# Patient Record
Sex: Female | Born: 1965 | Race: Black or African American | Hispanic: No | Marital: Single | Smoking: Never smoker
Health system: Southern US, Community
[De-identification: ages and names within clinical notes are randomized; demographics above are authoritative.]

## PROBLEM LIST (undated history)

## (undated) DIAGNOSIS — I10 Essential (primary) hypertension: Secondary | ICD-10-CM

## (undated) HISTORY — PX: NO PAST SURGERIES: SHX2092

---

## 1998-09-18 ENCOUNTER — Emergency Department (HOSPITAL_COMMUNITY): Admission: EM | Admit: 1998-09-18 | Discharge: 1998-09-18 | Payer: Self-pay | Admitting: Emergency Medicine

## 1998-12-31 ENCOUNTER — Emergency Department (HOSPITAL_COMMUNITY): Admission: EM | Admit: 1998-12-31 | Discharge: 1998-12-31 | Payer: Self-pay | Admitting: Emergency Medicine

## 2003-09-03 ENCOUNTER — Emergency Department (HOSPITAL_COMMUNITY): Admission: AD | Admit: 2003-09-03 | Discharge: 2003-09-03 | Payer: Self-pay | Admitting: Family Medicine

## 2007-05-06 ENCOUNTER — Emergency Department (HOSPITAL_COMMUNITY): Admission: EM | Admit: 2007-05-06 | Discharge: 2007-05-06 | Payer: Self-pay | Admitting: Emergency Medicine

## 2008-11-18 ENCOUNTER — Encounter: Admission: RE | Admit: 2008-11-18 | Discharge: 2008-11-18 | Payer: Self-pay | Admitting: Family Medicine

## 2009-06-04 ENCOUNTER — Emergency Department (HOSPITAL_COMMUNITY): Admission: EM | Admit: 2009-06-04 | Discharge: 2009-06-04 | Payer: Self-pay | Admitting: Family Medicine

## 2011-05-21 ENCOUNTER — Inpatient Hospital Stay (HOSPITAL_COMMUNITY)
Admission: AD | Admit: 2011-05-21 | Discharge: 2011-05-21 | Disposition: A | Discharge status: Home / Self Care | Payer: Self-pay | Source: Ambulatory Visit | Attending: Obstetrics & Gynecology | Admitting: Obstetrics & Gynecology

## 2011-05-21 ENCOUNTER — Encounter (HOSPITAL_COMMUNITY): Payer: Self-pay | Admitting: *Deleted

## 2011-05-21 DIAGNOSIS — L02419 Cutaneous abscess of limb, unspecified: Secondary | ICD-10-CM

## 2011-05-21 DIAGNOSIS — L03119 Cellulitis of unspecified part of limb: Secondary | ICD-10-CM

## 2011-05-21 DIAGNOSIS — L02423 Furuncle of right upper limb: Secondary | ICD-10-CM

## 2011-05-21 DIAGNOSIS — L02429 Furuncle of limb, unspecified: Secondary | ICD-10-CM

## 2011-05-21 DIAGNOSIS — L02439 Carbuncle of limb, unspecified: Secondary | ICD-10-CM

## 2011-05-21 HISTORY — DX: Essential (primary) hypertension: I10

## 2011-05-21 MED ORDER — CEPHALEXIN 500 MG PO CAPS: 500.0000 mg | ORAL_CAPSULE | Freq: Four times a day (QID) | ORAL | Status: AC

## 2011-05-21 NOTE — ED Notes (Signed)
2150 Kerri Daniels CNM in to see pt

## 2011-05-21 NOTE — ED Notes (Signed)
Are R posterior thigh has reddened area about 10cm size around "boil" area

## 2011-05-21 NOTE — Progress Notes (Signed)
Bump started on R posterior thigh and was itching on Tues. Grew larger by Weds am and a lot of pain. Then another area started On R buttocks and had similar symptoms. Noticed area on L lower back today that was itching somewhat like the other bumps started. Also has bump on L forehead. Has bandaid on area on thigh and buttocks and drainage noted on bandaid.

## 2011-05-21 NOTE — ED Provider Notes (Signed)
History     Chief Complaint  Patient presents with  . Sore   HPI Presents with c/o "boils" on right leg and buttocks. States started 5 days ago as "mosquito bite" and got bigger. No fever or other symptoms. No prior treatment. Does not have a primary care doctor.    Past Medical History  Diagnosis Date  . Diabetes mellitus   . Hypertension     Past Surgical History  Procedure Date  . No past surgeries     No family history on file.  History  Substance Use Topics  . Smoking status: Never Smoker   . Smokeless tobacco: Not on file  . Alcohol Use: No    Allergies: Allergies no known allergies  Prescriptions prior to admission  Medication Sig Dispense Refill  . Amlodipine Besy-Benazepril HCl (LOTREL PO) Take 1 tablet by mouth every morning.        . AZO-CRANBERRY PO Take 2 tablets by mouth daily.        . Benzocaine-Ichthammol-Sulfur (BOIL-EASE) 5-1.86-0.44 % OINT Apply 1 application topically daily as needed. For boils        . Biotin 5000 MCG TABS Take 1 tablet by mouth daily.        . Black Cohosh 540 MG CAPS Take 1 capsule by mouth daily.        . Ferrous Sulfate 143 (45 FE) MG TBCR Take 1 tablet by mouth daily.        . metFORMIN (GLUCOPHAGE-XR) 500 MG 24 hr tablet Take 2,000 mg by mouth daily with supper.        . Multiple Vitamin (MULTI-VITAMIN DAILY PO) Take 1 tablet by mouth daily.        . naproxen sodium (ANAPROX) 220 MG tablet Take 220 mg by mouth daily.        Marland Kitchen OVER THE COUNTER MEDICATION Take 1 capsule by mouth daily. Energ-V herbal supplement       . phentermine (ADIPEX-P) 37.5 MG tablet Take 37.5 mg by mouth every morning.          Review of Systems  Constitutional: Negative for fever and chills.  Skin: Negative for rash.    Physical Exam   Blood pressure 151/97, pulse 97, temperature 98.2 F (36.8 C), temperature source Oral, resp. rate 20, height 5\' 9"  (1.753 m), weight 204 lb 6.4 oz (92.715 kg), last menstrual period 05/05/2011.  Physical Exam   Constitutional: She is oriented to person, place, and time. She appears well-developed and well-nourished.  Respiratory: Effort normal.  Musculoskeletal: Normal range of motion.  Neurological: She is alert and oriented to person, place, and time.  Skin: Skin is warm and dry. Lesion noted.          Pustular lesion right inner thigh about 1cm with surrounding area of cellulitis about 10 cm.  Second lesion on right hip/buttocks, 1cm with no cellulitis. Tiny pustule Left lower back  Psychiatric: She has a normal mood and affect.    MAU Course  Procedures   Assessment and Plan  A:  Skin lesions with cellulitis P:  Consulted Dr Gaynell Face  Culture sent Will Rx Keflex and have her follow up with Dr Tamela Oddi Monday.    Wynelle Bourgeois 05/21/2011, 10:12 PM

## 2011-05-21 NOTE — ED Notes (Signed)
Wynelle Bourgeois CNM took culture of area on R thigh and sent to lab.

## 2011-05-21 NOTE — Progress Notes (Signed)
Written and verbal d/c instructions given and understanding voiced. 

## 2011-05-24 ENCOUNTER — Telehealth (HOSPITAL_COMMUNITY): Payer: Self-pay | Admitting: *Deleted

## 2011-05-24 LAB — CULTURE, ROUTINE-ABSCESS

## 2013-08-31 ENCOUNTER — Encounter (HOSPITAL_COMMUNITY): Payer: Self-pay | Admitting: Emergency Medicine

## 2013-08-31 ENCOUNTER — Emergency Department (HOSPITAL_COMMUNITY)
Admission: EM | Admit: 2013-08-31 | Discharge: 2013-09-01 | Disposition: A | Discharge status: Home / Self Care | Payer: Self-pay | Attending: Emergency Medicine | Admitting: Emergency Medicine

## 2013-08-31 DIAGNOSIS — Z792 Long term (current) use of antibiotics: Secondary | ICD-10-CM | POA: Insufficient documentation

## 2013-08-31 DIAGNOSIS — L02419 Cutaneous abscess of limb, unspecified: Secondary | ICD-10-CM | POA: Insufficient documentation

## 2013-08-31 DIAGNOSIS — L03119 Cellulitis of unspecified part of limb: Principal | ICD-10-CM

## 2013-08-31 DIAGNOSIS — Z79899 Other long term (current) drug therapy: Secondary | ICD-10-CM | POA: Insufficient documentation

## 2013-08-31 DIAGNOSIS — L02416 Cutaneous abscess of left lower limb: Secondary | ICD-10-CM

## 2013-08-31 DIAGNOSIS — E119 Type 2 diabetes mellitus without complications: Secondary | ICD-10-CM | POA: Insufficient documentation

## 2013-08-31 DIAGNOSIS — I10 Essential (primary) hypertension: Secondary | ICD-10-CM | POA: Insufficient documentation

## 2013-08-31 MED ORDER — CEPHALEXIN 500 MG PO CAPS: 500.0000 mg | ORAL_CAPSULE | Freq: Four times a day (QID) | ORAL | Status: DC

## 2013-08-31 MED ORDER — OXYCODONE-ACETAMINOPHEN 5-325 MG PO TABS: 1.0000 | ORAL_TABLET | Freq: Four times a day (QID) | ORAL | Status: DC | PRN

## 2013-08-31 MED ORDER — SULFAMETHOXAZOLE-TRIMETHOPRIM 800-160 MG PO TABS: 1.0000 | ORAL_TABLET | Freq: Two times a day (BID) | ORAL | Status: AC

## 2013-08-31 MED ORDER — IBUPROFEN 800 MG PO TABS
800.0000 mg | ORAL_TABLET | Freq: Once | ORAL | Status: AC
  Administered 2013-08-31: 800 mg via ORAL
  Filled 2013-08-31: qty 1

## 2013-08-31 NOTE — ED Notes (Signed)
Pt arrived to the ED with a complaint of leg sore.  Pt had a pimple on her leg which she popped on Sunday.  Pt is diabetic.  Pt take metformin.  Pt wound is now larger and white.  Wound appears closed . Wound is on left leg

## 2013-08-31 NOTE — ED Notes (Signed)
Gauze applied to wound and wrapped with kerlix. Pt instructed on further wound care.

## 2013-08-31 NOTE — Discharge Instructions (Signed)
Take the antibiotics as prescribed. Do not stop antibiotics early. Apply warm moist compresses or use warm soaks to the area at least 4 times a day to promote drainage. Change your dressing at least twice per day to keep the area clean and dry. Your first dressing change should be tomorrow morning. Followup in 48 hours with your primary care provider or in the emergency department for a recheck of your abscess. Return to the emergency department if symptoms worsen.  Abscess Care After An abscess (also called a boil or furuncle) is an infected area that contains a collection of pus. Signs and symptoms of an abscess include pain, tenderness, redness, or hardness, or you may feel a moveable soft area under your skin. An abscess can occur anywhere in the body. The infection may spread to surrounding tissues causing cellulitis. A cut (incision) by the surgeon was made over your abscess and the pus was drained out. Gauze may have been packed into the space to provide a drain that will allow the cavity to heal from the inside outwards. The boil may be painful for 5 to 7 days. Most people with a boil do not have high fevers. Your abscess, if seen early, may not have localized, and may not have been lanced. If not, another appointment may be required for this if it does not get better on its own or with medications. HOME CARE INSTRUCTIONS   Only take over-the-counter or prescription medicines for pain, discomfort, or fever as directed by your caregiver.  When you bathe, soak and then remove gauze or iodoform packs at least daily or as directed by your caregiver. You may then wash the wound gently with mild soapy water. Repack with gauze or do as your caregiver directs. SEEK IMMEDIATE MEDICAL CARE IF:   You develop increased pain, swelling, redness, drainage, or bleeding in the wound site.  You develop signs of generalized infection including muscle aches, chills, fever, or a general ill feeling.  An oral  temperature above 102 F (38.9 C) develops, not controlled by medication. See your caregiver for a recheck if you develop any of the symptoms described above. If medications (antibiotics) were prescribed, take them as directed. Document Released: 02/04/2005 Document Revised: 10/11/2011 Document Reviewed: 10/02/2007 [facility] Patient Information 2014 [employer], [place].

## 2013-08-31 NOTE — ED Provider Notes (Signed)
CSN: [id number]     Arrival date & time 08/31/13  2130 History   First MD Initiated Contact with Patient 08/31/13 2250     This chart was scribed for Kerri Daniels, by Ladona Ridgelaylor Day, ED scribe. This patient was seen in room WTR6/WTR6 and the patient's care was started at 2250.  Chief Complaint  Patient presents with  . Sore   Patient is a 48 y.o. female presenting with abscess. The history is provided by the patient. No language interpreter was used.  Abscess Location:  Leg Leg abscess location:  L upper leg Abscess quality: draining, painful, redness and warmth   Red streaking: no   Duration:  5 days Progression:  Worsening Pain details:    Severity:  Moderate   Duration:  5 days   Timing:  Constant Chronicity:  New Context: diabetes   Relieved by:  Nothing Ineffective treatments:  Draining/squeezing Associated symptoms: no fever    HPI Comments: Kerri Daniels is a 48 y.o. female who presents to the Emergency Department w/hx of DM complaining of painful/swollen constant, gradually worsened abscess to her left inner thigh, onset 5 days ago. She reports it began as a small pimple over her thigh which she popped 5 days ago and reports since this time it has worsened in pain/swelling. She reports associated warmth/tenderness of this area, no fever/chills. She has been using alcohol wipes, neosporin and betadine topically w/no improvement. She denies any red streaking, numbness/weakness of her legs. She reports clear drainage and no purulent drainage.   No allergies to medicines.   Past Medical History  Diagnosis Date  . Diabetes mellitus   . Hypertension    Past Surgical History  Procedure Laterality Date  . No past surgeries     History reviewed. No pertinent family history. History  Substance Use Topics  . Smoking status: Never Smoker   . Smokeless tobacco: Not on file  . Alcohol Use: Yes   OB History   Grav Para Term Preterm Abortions TAB SAB Ect Mult Living   7 7 7  0 0 0 0  0 0 6     Review of Systems  Constitutional: Negative for fever and chills.  Skin: Positive for wound (abscess left inenr thigh).  All other systems reviewed and are negative.   A complete 10 system review of systems was obtained and all systems are negative except as noted in the HPI and PMH.   Allergies  Review of patient's allergies indicates no known allergies.  Home Medications   Current Outpatient Rx  Name  Route  Sig  Dispense  Refill  . amLODipine-benazepril (LOTREL) 5-10 MG per capsule   Oral   Take 1 capsule by mouth every morning.         . Cholecalciferol (VITAMIN D-3 PO)   Oral   Take 1 tablet by mouth daily.         Marland Kitchen. ibuprofen (ADVIL,MOTRIN) 200 MG tablet   Oral   Take 200 mg by mouth every 6 (six) hours as needed for moderate pain.         . metFORMIN (GLUCOPHAGE-XR) 500 MG 24 hr tablet   Oral   Take 2,000 mg by mouth daily with supper.           . Multiple Vitamin (MULTI-VITAMIN DAILY PO)   Oral   Take 1 tablet by mouth daily.           . naproxen (NAPROSYN) 500 MG tablet   Oral  Take 500 mg by mouth 2 (two) times daily as needed for mild pain.         . vitamin B-12 (CYANOCOBALAMIN) 1000 MCG tablet   Oral   Take 1,000 mcg by mouth daily.         . cephALEXin (KEFLEX) 500 MG capsule   Oral   Take 1 capsule (500 mg total) by mouth 4 (four) times daily.   28 capsule   0   . oxyCODONE-acetaminophen (PERCOCET/ROXICET) 5-325 MG per tablet   Oral   Take 1 tablet by mouth every 6 (six) hours as needed for severe pain.   5 tablet   0   . sulfamethoxazole-trimethoprim (BACTRIM DS,SEPTRA DS) 800-160 MG per tablet   Oral   Take 1 tablet by mouth 2 (two) times daily.   14 tablet   0    Triage Vitals: BP 169/97  Pulse 105  Temp(Src) 98.2 F (36.8 C) (Oral)  Resp 18  SpO2 100%  LMP 08/16/2013  Physical Exam  Nursing note and vitals reviewed. Constitutional: She is oriented to person, place, and time. She appears  well-developed and well-nourished. No distress.  HENT:  Head: Normocephalic and atraumatic.  Eyes: Conjunctivae and EOM are normal. No scleral icterus.  Neck: Normal range of motion.  Cardiovascular: Normal rate, regular rhythm and intact distal pulses.   Dorsalis pedis and posterior tibial pulses 2+ in left lower extremity  Pulmonary/Chest: Effort normal. No respiratory distress.  Musculoskeletal: Normal range of motion.  Neurological: She is alert and oriented to person, place, and time. She has normal reflexes.  No gross deficits appreciated. DTRs normal and symmetric. Patient ambulatory with normal gait.  Skin: Skin is warm and dry. No rash noted. She is not diaphoretic. No pallor.  Patient with abscess to anterior mid left upper thigh. There is a central area of fluctuance approximately 1 cm in diameter with surrounding induration extending approximately 5 cm from center. Induration erythematous with mild heat to touch. No red linear streaking appreciated. Wound draining scant amount of purulent bloody drainage. Tender to palpation.  Psychiatric: She has a normal mood and affect. Her behavior is normal.    ED Course  Procedures (including critical care time) DIAGNOSTIC STUDIES: Oxygen Saturation is 100% on room air, normal by my interpretation.    Labs Review Labs Reviewed - No data to display Imaging Review No results found.  EKG Interpretation   None      INCISION AND DRAINAGE Performed by: Kerri Madura Consent: Verbal consent obtained. Risks and benefits: risks, benefits and alternatives were discussed Type: abscess  Body area: L thigh  Anesthesia: local infiltration  Incision was made with a scalpel.  Local anesthetic: lidocaine 2% with epinephrine  Anesthetic total: 7 ml  Complexity: complex Blunt dissection to break up loculations  Drainage: purulent and bloody  Drainage amount: moderate  Packing material: none  Patient tolerance: Patient tolerated  the procedure well with no immediate complications.   MDM   1. Abscess of left thigh    Uncomplicated abscess of left thigh. Patient without history of abscesses or MRSA. She is well and nontoxic appearing, hemodynamically stable, and afebrile. No gross sensory deficits appreciated. She is neurovascularly intact on physical exam. I&D performed at bedside with moderate amount of purulent/bloody drainage. Patient tolerated well. She is stable for discharge with instruction to followup for recheck in 48 hours. Also advised warm soaks and compresses. Will also prescribe Bactrim and Keflex as patient is a diabetic. Return precautions provided  and patient agreeable to plan with no unaddressed concerns.  I personally performed the services described in this documentation, which was scribed in my presence. The recorded information has been reviewed and is accurate.  Filed Vitals:   08/31/13 2157  BP: 169/97  Pulse: 105  Temp: 98.2 F (36.8 C)  TempSrc: Oral  Resp: 18  SpO2: 100%       Kerri Madura, PA-C 08/31/13 2320

## 2013-09-01 NOTE — ED Provider Notes (Signed)
Medical screening examination/treatment/procedure(s) were performed by non-physician practitioner and as supervising physician I was immediately available for consultation/collaboration.   Crystalle Popwell, MD 09/01/13 0757 

## 2014-06-03 ENCOUNTER — Encounter (HOSPITAL_COMMUNITY): Payer: Self-pay | Admitting: Emergency Medicine

## 2014-12-12 ENCOUNTER — Ambulatory Visit
Admission: RE | Admit: 2014-12-12 | Discharge: 2014-12-12 | Disposition: A | Discharge status: Home / Self Care | Payer: 59 | Source: Ambulatory Visit | Attending: Family Medicine | Admitting: Family Medicine

## 2014-12-12 ENCOUNTER — Other Ambulatory Visit: Payer: Self-pay | Admitting: Family Medicine

## 2014-12-12 DIAGNOSIS — M25511 Pain in right shoulder: Secondary | ICD-10-CM

## 2015-12-23 ENCOUNTER — Ambulatory Visit
Admission: RE | Admit: 2015-12-23 | Discharge: 2015-12-23 | Disposition: A | Discharge status: Home / Self Care | Payer: BLUE CROSS/BLUE SHIELD | Source: Ambulatory Visit | Attending: Family Medicine | Admitting: Family Medicine

## 2015-12-23 ENCOUNTER — Other Ambulatory Visit: Payer: Self-pay | Admitting: Family Medicine

## 2015-12-23 DIAGNOSIS — M25512 Pain in left shoulder: Secondary | ICD-10-CM

## 2016-07-05 NOTE — Progress Notes (Signed)
Tawana ScaleZach Makesha Belitz D.O. [facility] [address] [place], [place] [address] Phone: [phone] Subjective:     CC: Bilateral shoulder pain  WGN:[id number]:Subjective  Kerri ArnoldStacy Daniels is a 50 y.o. female coming in with complaint of bilateral shoulder pain. States that they have been increasing over the course of the numerous weeks. 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 Patient states that unfortunately is is given her enough pain that is waking her up at night. States that it is making certain things such as dressing even difficult. Has noticed some mild decreased range of motion. Rates the severity pain is 8 out of 10.     Past Medical History:  Diagnosis Date  . Diabetes mellitus   . Hypertension    Past Surgical History:  Procedure Laterality Date  . NO PAST SURGERIES     Social History   Social History  . Marital status: Single    Spouse name: N/A  . Number of children: N/A  . Years of education: N/A   Social History Main Topics  . Smoking status: Never Smoker  . Smokeless tobacco: None  . Alcohol use Yes  . Drug use: No  . Sexual activity: Yes    Birth control/ protection: Surgical   Other Topics Concern  . None   Social History Narrative  . None   No Known Allergies No family history on file.  Past medical history, social, surgical and family history all reviewed in electronic medical record.  No pertanent information unless stated regarding to the chief complaint.   Review of Systems:Review of systems updated and as accurate as of 07/06/16  No headache, visual changes, nausea, vomiting, diarrhea, constipation, dizziness, abdominal pain, skin rash, fevers, chills, night sweats, weight loss, swollen lymph nodes, body aches, joint swelling, muscle aches, chest pain, shortness of breath, mood changes.   Objective  Blood pressure (!) 144/88, pulse (!) 109, height 5\' 9"  (1.753 m), weight 219 lb (99.3 kg),  SpO2 98 %. Systems examined below as of 07/06/16   General: No apparent distress alert and oriented x3 mood and affect normal, dressed appropriately.  HEENT: Pupils equal, extraocular movements intact  Respiratory: Patient's speak in full sentences and does not appear short of breath  Cardiovascular: No lower extremity edema, non tender, no erythema  Skin: Warm dry intact with no signs of infection or rash on extremities or on axial skeleton.  Abdomen: Soft nontender  Neuro: Cranial nerves II through XII are intact, neurovascularly intact in all extremities with 2+ DTRs and 2+ pulses.  Lymph: No lymphadenopathy of posterior or anterior cervical chain or axillae bilaterally.  Gait normal with good balance and coordination.  MSK:  Non tender with full range of motion and good stability and symmetric strength and tone of  elbows, wrist, hip, knee and ankles bilaterally.  Shoulder: Bilateral Inspection reveals no abnormalities, atrophy or asymmetry. Palpation is normal with no tenderness over AC joint or bicipital groove. Right shoulder lays lacking the last 10 of 4 flexion only has 5 of external rotation. Left shoulder has full forward flexion, lacking 5 of external rotation and has only internal rotation to sacrum. Rotator cuff strength normal throughout. Positive impingement bilaterally Speeds and Yergason's tests normal. No labral pathology noted with negative Obrien's, negative clunk and good stability. Normal scapular function observed. No painful arc and no drop arm sign. No apprehension sign  Procedure: Real-time Ultrasound Guided Injection of right glenohumeral joint Device:  GE Logiq E  Ultrasound guided injection is preferred based studies that show increased duration, increased effect, greater accuracy, decreased procedural pain, increased response rate with ultrasound guided versus blind injection.  Verbal informed consent obtained.  Time-out conducted.  Noted no overlying  erythema, induration, or other signs of local infection.  Skin prepped in a sterile fashion.  Local anesthesia: Topical Ethyl chloride.  With sterile technique and under real time ultrasound guidance:  Joint visualized.  23g 1  inch needle inserted posterior approach. Pictures taken for needle placement. Patient did have injection of 2 cc of 1% lidocaine, 2 cc of 0.5% Marcaine, and 1.0 cc of Kenalog 40 mg/dL. Completed without difficulty  Pain immediately resolved suggesting accurate placement of the medication.  Advised to call if fevers/chills, erythema, induration, drainage, or persistent bleeding.  Images permanently stored and available for review in the ultrasound unit.  Impression: Technically successful ultrasound guided injection.   Procedure: Real-time Ultrasound Guided Injection of left glenohumeral joint Device: GE Logiq E  Ultrasound guided injection is preferred based studies that show increased duration, increased effect, greater accuracy, decreased procedural pain, increased response rate with ultrasound guided versus blind injection.  Verbal informed consent obtained.  Time-out conducted.  Noted no overlying erythema, induration, or other signs of local infection.  Skin prepped in a sterile fashion.  Local anesthesia: Topical Ethyl chloride.  With sterile technique and under real time ultrasound guidance:  Joint visualized.  23g 1  inch needle inserted posterior approach. Pictures taken for needle placement. Patient did have injection of 2 cc of 1% lidocaine, 2 cc of 0.5% Marcaine, and 1cc of Kenalog 40 mg/dL. Completed without difficulty  Pain immediately resolved suggesting accurate placement of the medication.  Advised to call if fevers/chills, erythema, induration, drainage, or persistent bleeding.  Images permanently stored and available for review in the ultrasound unit.  Impression: Technically successful ultrasound guided injection.   Procedure note 97110; 15  minutes spent for Therapeutic exercises as stated in above notes.  This included exercises focusing on stretching, strengthening, with significant focus on eccentric aspects. Shoulder Exercises that included:  Basic scapular stabilization to include adduction and depression of scapula Scaption, focusing on proper movement and good control Internal and External rotation utilizing a theraband, with elbow tucked at side entire time Rows with theraband    Proper technique shown and discussed handout in great detail with ATC.  All questions were discussed and answered.       Impression and Recommendations:     This case required medical decision making of moderate complexity.      Note: This dictation was prepared with Dragon dictation along with smaller phrase technology. Any transcriptional errors that result from this process are unintentional.

## 2016-07-06 ENCOUNTER — Ambulatory Visit: Payer: Self-pay

## 2016-07-06 ENCOUNTER — Ambulatory Visit (INDEPENDENT_AMBULATORY_CARE_PROVIDER_SITE_OTHER): Payer: BLUE CROSS/BLUE SHIELD | Admitting: Family Medicine

## 2016-07-06 ENCOUNTER — Encounter: Payer: Self-pay | Admitting: Family Medicine

## 2016-07-06 VITALS — BP 144/88 | HR 109 | Ht 69.0 in | Wt 219.0 lb

## 2016-07-06 DIAGNOSIS — M25512 Pain in left shoulder: Secondary | ICD-10-CM

## 2016-07-06 DIAGNOSIS — E11618 Type 2 diabetes mellitus with other diabetic arthropathy: Secondary | ICD-10-CM | POA: Diagnosis not present

## 2016-07-06 DIAGNOSIS — M25511 Pain in right shoulder: Secondary | ICD-10-CM | POA: Diagnosis not present

## 2016-07-06 DIAGNOSIS — G8929 Other chronic pain: Secondary | ICD-10-CM | POA: Diagnosis not present

## 2016-07-06 DIAGNOSIS — M7500 Adhesive capsulitis of unspecified shoulder: Secondary | ICD-10-CM

## 2016-07-06 MED ORDER — DICLOFENAC SODIUM 2 % TD SOLN: 2.0000 "application " | Freq: Two times a day (BID) | TRANSDERMAL | 3 refills | Status: DC

## 2016-07-06 MED ORDER — VITAMIN D (ERGOCALCIFEROL) 1.25 MG (50000 UNIT) PO CAPS: 50000.0000 [IU] | ORAL_CAPSULE | ORAL | 0 refills | Status: DC

## 2016-07-06 NOTE — Patient Instructions (Signed)
Good to se eyou  PLEASE watch your sugars the next 3 days.  If it goes too high you need to see someone immediately.  Should go back down in 3 days but you should not go over 250! Ice 20 minutes 2 times daily. Usually after activity and before bed. Exercises 3 times a week.  pennsaid pinkie amount topically 2 times daily as needed.

## 2016-07-06 NOTE — Assessment & Plan Note (Addendum)
Bilateral injections given today. Tolerated the procedure well. We discussed home exercises and icing patient was given home exercises from a clinic trainer today. We discussed topical anti-inflammatories. Discuss once weekly vitamin D with history of diabetes as well as low vitamin D and causing frozen shoulder. Patient and will follow-up with me again in 4 weeks. Worsening symptoms consider formal physical therapy.

## 2016-08-02 NOTE — Progress Notes (Signed)
Kerri ScaleZach Hung Daniels D.O. [facility] [address] [place], [place] [address] Phone: [phone] Subjective:     CC: Bilateral shoulder pain f/u  BJY:NWGNFAOZHYHPI:Subjective  Kerri ArnoldStacy Daniels is a 51 y.o. female coming in with complaint of bilateral shoulder pain. Patient was found to have what appeared to be frozen shoulder bilaterally. Patient was given injections bilaterally was to start increasing her home exercises. Patient states Better overall. Still has some mild tightness in the upper back and neck. Patient states that the shoulders over doing significantly considerably better.  More pain in neck and trapezius.  Tightness. Patient denies any radiation down the arms or any numbness or tingling. As stated above more of the tightness of the upper neck, upper back and some of the trapezius muscle.    Past Medical History:  Diagnosis Date  . Diabetes mellitus   . Hypertension    Past Surgical History:  Procedure Laterality Date  . NO PAST SURGERIES     Social History   Social History  . Marital status: Single    Spouse name: N/A  . Number of children: N/A  . Years of education: N/A   Social History Main Topics  . Smoking status: Never Smoker  . Smokeless tobacco: None  . Alcohol use Yes  . Drug use: No  . Sexual activity: Yes    Birth control/ protection: Surgical   Other Topics Concern  . None   Social History Narrative  . None   No Known Allergies No family history on file. No family history rheumatological diseases.  Past medical history, social, surgical and family history all reviewed in electronic medical record.  No pertanent information unless stated regarding to the chief complaint.   Review of Systems: No headache, visual changes, nausea, vomiting, diarrhea, constipation, dizziness, abdominal pain, skin rash, fevers, chills, night sweats, weight loss, swollen lymph nodes, body aches, joint swelling, muscle aches, chest pain, shortness of breath, mood changes.  .      Objective  Blood pressure 128/84, pulse 95, height 5\' 9"  (1.753 m), weight 218 lb (98.9 kg), SpO2 97 %.   Systems examined below as of 08/03/16 General: NAD A&O x3 mood, affect normal  HEENT: Pupils equal, extraocular movements intact no nystagmus Respiratory: not short of breath at rest or with speaking Cardiovascular: No lower extremity edema, non tender Skin: Warm dry intact with no signs of infection or rash on extremities or on axial skeleton. Abdomen: Soft nontender, no masses Neuro: Cranial nerves  intact, neurovascularly intact in all extremities with 2+ DTRs and 2+ pulses. Lymph: No lymphadenopathy appreciated today  Gait normal with good balance and coordination.  MSK: Non tender with full range of motion and good stability and symmetric strength and tone of elbows, wrist,  knee hips and ankles bilaterally.   Shoulder: Bilateral Inspection reveals no abnormalities, atrophy or asymmetry. Full range of motion. Rotator cuff strength normal throughout. Negative impingement Speeds and Yergason's tests normal. No labral pathology noted with negative Obrien's, negative clunk and good stability. Normal scapular function observed. No painful arc and no drop arm sign. No apprehension sign Neck: Inspection unremarkable. No palpable stepoffs. Negative Spurling's maneuver. Mild limitation in range of motion lacking the last 5 of rotation bilaterally Grip strength and sensation normal in bilateral hands Strength good C4 to T1 distribution No sensory change to C4 to T1 Negative Hoffman sign bilaterally Reflexes normal  Osteopathic findings Cervical C2 flexed rotated and side bent right C4 flexed rotated and side bent left  C6 flexed rotated and side bent left T3 extended rotated and side bent right inhaled third rib       Impression and Recommendations:     This case required medical decision making of moderate complexity.      Note: This dictation was prepared  with Dragon dictation along with smaller phrase technology. Any transcriptional errors that result from this process are unintentional.

## 2016-08-03 ENCOUNTER — Encounter: Payer: Self-pay | Admitting: Family Medicine

## 2016-08-03 ENCOUNTER — Ambulatory Visit (INDEPENDENT_AMBULATORY_CARE_PROVIDER_SITE_OTHER): Payer: BLUE CROSS/BLUE SHIELD | Admitting: Family Medicine

## 2016-08-03 DIAGNOSIS — M7500 Adhesive capsulitis of unspecified shoulder: Secondary | ICD-10-CM

## 2016-08-03 DIAGNOSIS — M999 Biomechanical lesion, unspecified: Secondary | ICD-10-CM | POA: Insufficient documentation

## 2016-08-03 DIAGNOSIS — M542 Cervicalgia: Secondary | ICD-10-CM | POA: Insufficient documentation

## 2016-08-03 DIAGNOSIS — E11618 Type 2 diabetes mellitus with other diabetic arthropathy: Secondary | ICD-10-CM

## 2016-08-03 NOTE — Assessment & Plan Note (Signed)
Patient does have more of a dull, throbbing aching pain. I do think that this is secondary to muscle imbalances. We discussed icing regimen and home exercises. Discussed ergonomics throughout the day. Continue the once weekly vitamin D. Follow-up again in 4-6 weeks.

## 2016-08-03 NOTE — Patient Instructions (Addendum)
Good to see you  I am glad the shoulders are better.  We tried manipulation on your neck  Keep working on posture Stay active See me again in 4-6 weeks!

## 2016-08-03 NOTE — Assessment & Plan Note (Signed)
Improvement to the injections. No significant changes in management. I do believe that some of this is poor posture. Responded well to manipulation today.

## 2016-08-03 NOTE — Assessment & Plan Note (Signed)
Decision today to treat with OMT was based on Physical Exam  After verbal consent patient was treated with HVLA, ME, FPR techniques in cervical, thoracic and rib areas  Patient tolerated the procedure well with improvement in symptoms  Patient given exercises, stretches and lifestyle modifications  See medications in patient instructions if given  Patient will follow up in 3-4 weeks      

## 2016-08-30 NOTE — Progress Notes (Deleted)
Kerri ScaleZach Daniels D.O. [facility] [address] [place], [place] [address] Phone: [phone] Subjective:     CC: Bilateral shoulder pain f/u  BJY:NWGNFAOZHYHPI:Subjective  Kerri ArnoldStacy Daniels is a 51 y.o. female coming in with complaint of bilateral shoulder pain. Patient was found to have what appeared to be frozen shoulder bilaterally. Patient was given injections bilaterally was to start increasing her home exercises. Patient states Better overall. Still has some mild tightness in the upper back and neck. Patient states that the shoulders over doing significantly considerably better.  More pain in neck and trapezius.  Tightness. Patient denies any radiation down the arms or any numbness or tingling. As stated above more of the tightness of the upper neck, upper back and some of the trapezius muscle.    Past Medical History:  Diagnosis Date  . Diabetes mellitus   . Hypertension    Past Surgical History:  Procedure Laterality Date  . NO PAST SURGERIES     Social History   Social History  . Marital status: Single    Spouse name: N/A  . Number of children: N/A  . Years of education: N/A   Social History Main Topics  . Smoking status: Never Smoker  . Smokeless tobacco: Not on file  . Alcohol use Yes  . Drug use: No  . Sexual activity: Yes    Birth control/ protection: Surgical   Other Topics Concern  . Not on file   Social History Narrative  . No narrative on file   No Known Allergies No family history on file. No family history rheumatological diseases.  Past medical history, social, surgical and family history all reviewed in electronic medical record.  No pertanent information unless stated regarding to the chief complaint.   Review of Systems: No headache, visual changes, nausea, vomiting, diarrhea, constipation, dizziness, abdominal pain, skin rash, fevers, chills, night sweats, weight loss, swollen lymph nodes, body aches, joint swelling, muscle aches, chest pain, shortness  of breath, mood changes.  .    Objective  There were no vitals taken for this visit.   Systems examined below as of 08/30/16 General: NAD A&O x3 mood, affect normal  HEENT: Pupils equal, extraocular movements intact no nystagmus Respiratory: not short of breath at rest or with speaking Cardiovascular: No lower extremity edema, non tender Skin: Warm dry intact with no signs of infection or rash on extremities or on axial skeleton. Abdomen: Soft nontender, no masses Neuro: Cranial nerves  intact, neurovascularly intact in all extremities with 2+ DTRs and 2+ pulses. Lymph: No lymphadenopathy appreciated today  Gait normal with good balance and coordination.  MSK: Non tender with full range of motion and good stability and symmetric strength and tone of elbows, wrist,  knee hips and ankles bilaterally.   Shoulder: Bilateral Inspection reveals no abnormalities, atrophy or asymmetry. Full range of motion. Rotator cuff strength normal throughout. Negative impingement Speeds and Yergason's tests normal. No labral pathology noted with negative Obrien's, negative clunk and good stability. Normal scapular function observed. No painful arc and no drop arm sign. No apprehension sign Neck: Inspection unremarkable. No palpable stepoffs. Negative Spurling's maneuver. Mild limitation in range of motion lacking the last 5 of rotation bilaterally Grip strength and sensation normal in bilateral hands Strength good C4 to T1 distribution No sensory change to C4 to T1 Negative Hoffman sign bilaterally Reflexes normal  Osteopathic findings Cervical C2 flexed rotated and side bent right C4 flexed rotated and side bent left C6 flexed rotated and  side bent left T3 extended rotated and side bent right inhaled third rib       Impression and Recommendations:     This case required medical decision making of moderate complexity.      Note: This dictation was prepared with Dragon  dictation along with smaller phrase technology. Any transcriptional errors that result from this process are unintentional.

## 2016-08-31 ENCOUNTER — Ambulatory Visit: Payer: BLUE CROSS/BLUE SHIELD | Admitting: Family Medicine

## 2016-09-29 ENCOUNTER — Other Ambulatory Visit: Payer: Self-pay | Admitting: Family Medicine

## 2016-10-08 ENCOUNTER — Encounter: Payer: Self-pay | Admitting: *Deleted

## 2016-10-08 DIAGNOSIS — I10 Essential (primary) hypertension: Secondary | ICD-10-CM | POA: Insufficient documentation

## 2016-10-08 DIAGNOSIS — Z79899 Other long term (current) drug therapy: Secondary | ICD-10-CM | POA: Insufficient documentation

## 2016-10-08 DIAGNOSIS — R42 Dizziness and giddiness: Secondary | ICD-10-CM | POA: Insufficient documentation

## 2016-10-08 DIAGNOSIS — Z7984 Long term (current) use of oral hypoglycemic drugs: Secondary | ICD-10-CM | POA: Diagnosis not present

## 2016-10-08 DIAGNOSIS — E119 Type 2 diabetes mellitus without complications: Secondary | ICD-10-CM | POA: Insufficient documentation

## 2016-10-08 DIAGNOSIS — Z5321 Procedure and treatment not carried out due to patient leaving prior to being seen by health care provider: Secondary | ICD-10-CM | POA: Diagnosis not present

## 2016-10-08 LAB — URINALYSIS, COMPLETE (UACMP) WITH MICROSCOPIC
BACTERIA UA: NONE SEEN
BILIRUBIN URINE: NEGATIVE
Glucose, UA: NEGATIVE mg/dL
HGB URINE DIPSTICK: NEGATIVE
Ketones, ur: NEGATIVE mg/dL
Leukocytes, UA: NEGATIVE
NITRITE: NEGATIVE
PH: 6 (ref 5.0–8.0)
Protein, ur: NEGATIVE mg/dL
SPECIFIC GRAVITY, URINE: 1.003 — AB (ref 1.005–1.030)

## 2016-10-08 LAB — BASIC METABOLIC PANEL
Anion gap: 7 (ref 5–15)
BUN: 8 mg/dL (ref 6–20)
CALCIUM: 9.2 mg/dL (ref 8.9–10.3)
CO2: 27 mmol/L (ref 22–32)
CREATININE: 0.75 mg/dL (ref 0.44–1.00)
Chloride: 102 mmol/L (ref 101–111)
GFR calc Af Amer: >60 mL/min (ref >60)
GLUCOSE: 177 mg/dL — AB (ref 65–99)
Potassium: 3.5 mmol/L (ref 3.5–5.1)
Sodium: 136 mmol/L (ref 135–145)

## 2016-10-08 LAB — CBC
HCT: 36.4 % (ref 35.0–47.0)
Hemoglobin: 12.3 g/dL (ref 12.0–16.0)
MCH: 29.8 pg (ref 26.0–34.0)
MCHC: 33.7 g/dL (ref 32.0–36.0)
MCV: 88.7 fL (ref 80.0–100.0)
Platelets: 259 10*3/uL (ref 150–440)
RBC: 4.1 MIL/uL (ref 3.80–5.20)
RDW: 13.8 % (ref 11.5–14.5)
WBC: 8.1 10*3/uL (ref 3.6–11.0)

## 2016-10-08 NOTE — ED Triage Notes (Addendum)
Pt to ED reporting elevated BP at urgent care and dizziness. Pt has hx of HTN. Currently 158/96. Pt reports having had NVD since Wednesday. Pt reports yesterday symptoms worsened. No fevers reported at home. Pt is reporting increased stress levels with work recently.

## 2016-10-09 ENCOUNTER — Emergency Department
Admission: EM | Admit: 2016-10-09 | Discharge: 2016-10-09 | Disposition: A | Discharge status: Home / Self Care | Payer: BLUE CROSS/BLUE SHIELD | Attending: Emergency Medicine | Admitting: Emergency Medicine

## 2016-10-13 ENCOUNTER — Other Ambulatory Visit: Payer: Self-pay

## 2016-10-13 ENCOUNTER — Other Ambulatory Visit: Payer: Self-pay | Admitting: Family Medicine

## 2016-10-13 MED ORDER — DICLOFENAC SODIUM 2 % TD SOLN: 2.0000 "application " | Freq: Two times a day (BID) | TRANSDERMAL | 2 refills | Status: AC

## 2016-10-26 ENCOUNTER — Other Ambulatory Visit: Payer: Self-pay | Admitting: Cardiology

## 2016-10-26 DIAGNOSIS — I10 Essential (primary) hypertension: Secondary | ICD-10-CM

## 2016-10-29 ENCOUNTER — Other Ambulatory Visit: Payer: BLUE CROSS/BLUE SHIELD

## 2018-04-12 ENCOUNTER — Ambulatory Visit: Payer: BLUE CROSS/BLUE SHIELD | Admitting: Podiatry

## 2018-07-18 ENCOUNTER — Other Ambulatory Visit: Payer: Self-pay | Admitting: Podiatry

## 2018-07-18 DIAGNOSIS — M2580 Other specified joint disorders, unspecified joint: Secondary | ICD-10-CM

## 2018-07-27 ENCOUNTER — Ambulatory Visit: Payer: BLUE CROSS/BLUE SHIELD

## 2018-08-17 ENCOUNTER — Ambulatory Visit: Admit: 2018-08-17 | Payer: BLUE CROSS/BLUE SHIELD | Admitting: Podiatry

## 2018-08-17 SURGERY — SESMOIDECTOMY
Anesthesia: Choice | Laterality: Left

## 2018-09-02 ENCOUNTER — Other Ambulatory Visit: Payer: Self-pay

## 2018-09-02 ENCOUNTER — Encounter: Payer: Self-pay | Admitting: Emergency Medicine

## 2018-09-02 ENCOUNTER — Emergency Department
Admission: EM | Admit: 2018-09-02 | Discharge: 2018-09-02 | Disposition: A | Discharge status: Home / Self Care | Payer: Worker's Compensation | Attending: Emergency Medicine | Admitting: Emergency Medicine

## 2018-09-02 ENCOUNTER — Emergency Department: Payer: Worker's Compensation

## 2018-09-02 DIAGNOSIS — E119 Type 2 diabetes mellitus without complications: Secondary | ICD-10-CM | POA: Diagnosis not present

## 2018-09-02 DIAGNOSIS — S060X0A Concussion without loss of consciousness, initial encounter: Secondary | ICD-10-CM | POA: Diagnosis not present

## 2018-09-02 DIAGNOSIS — Z7984 Long term (current) use of oral hypoglycemic drugs: Secondary | ICD-10-CM | POA: Insufficient documentation

## 2018-09-02 DIAGNOSIS — W228XXA Striking against or struck by other objects, initial encounter: Secondary | ICD-10-CM | POA: Insufficient documentation

## 2018-09-02 DIAGNOSIS — Z79899 Other long term (current) drug therapy: Secondary | ICD-10-CM | POA: Diagnosis not present

## 2018-09-02 DIAGNOSIS — Y9389 Activity, other specified: Secondary | ICD-10-CM | POA: Insufficient documentation

## 2018-09-02 DIAGNOSIS — Y929 Unspecified place or not applicable: Secondary | ICD-10-CM | POA: Diagnosis not present

## 2018-09-02 DIAGNOSIS — S0990XA Unspecified injury of head, initial encounter: Secondary | ICD-10-CM | POA: Diagnosis present

## 2018-09-02 DIAGNOSIS — I10 Essential (primary) hypertension: Secondary | ICD-10-CM | POA: Diagnosis not present

## 2018-09-02 DIAGNOSIS — Y990 Civilian activity done for income or pay: Secondary | ICD-10-CM | POA: Insufficient documentation

## 2018-09-02 NOTE — ED Notes (Signed)
Pt to ED from work where she states a door closed on her hitting her head. Pt states she "saw stars" but no LOC. Pt has small scratch to upper right side of head. She says if feels like its "going into her right eye". Pt ambulatory to flex room.

## 2018-09-02 NOTE — Discharge Instructions (Addendum)
Follow-up with your occupational health clinic at [facility].  Let them know that you have been diagnosed with a concussion.  You should avoid bright lights, TVs, loud noises, iPads, cell phones as much as possible.  These tend to make head injury symptoms worse.  You need to be rechecked with occupational health prior to returning to work.
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
## 2018-09-02 NOTE — ED Notes (Signed)
Signature pad not working. Hard copy printed and signed by patient.  

## 2018-09-02 NOTE — ED Triage Notes (Signed)
Pt works at [employer] and was throwing laundry down chute and door hit her in head.  Was seen at occupation health that day.  Still having pain in heady and at times dizziness from it.  No CT was done that day.  Was told to come for CT if not better.  Is worker comp. Unsure if needs profile done since occupational health was seen and no drug testing was done there.  Ambulatory with steady gait. No LOC with incident.  VSS

## 2018-09-02 NOTE — ED Notes (Signed)
Pt verbalized understanding of discharge instructions. NAD at this time. 

## 2018-09-22 ENCOUNTER — Other Ambulatory Visit: Payer: Self-pay

## 2018-09-22 ENCOUNTER — Encounter: Payer: Self-pay | Admitting: Emergency Medicine

## 2018-09-22 ENCOUNTER — Emergency Department
Admission: EM | Admit: 2018-09-22 | Discharge: 2018-09-22 | Disposition: A | Discharge status: Home / Self Care | Payer: Worker's Compensation | Attending: Emergency Medicine | Admitting: Emergency Medicine

## 2018-09-22 DIAGNOSIS — I10 Essential (primary) hypertension: Secondary | ICD-10-CM | POA: Insufficient documentation

## 2018-09-22 DIAGNOSIS — G44309 Post-traumatic headache, unspecified, not intractable: Secondary | ICD-10-CM | POA: Diagnosis not present

## 2018-09-22 DIAGNOSIS — E119 Type 2 diabetes mellitus without complications: Secondary | ICD-10-CM | POA: Insufficient documentation

## 2018-09-22 DIAGNOSIS — Z7984 Long term (current) use of oral hypoglycemic drugs: Secondary | ICD-10-CM | POA: Insufficient documentation

## 2018-09-22 DIAGNOSIS — R51 Headache: Secondary | ICD-10-CM | POA: Diagnosis present

## 2018-09-22 DIAGNOSIS — Z79899 Other long term (current) drug therapy: Secondary | ICD-10-CM | POA: Insufficient documentation

## 2018-09-22 DIAGNOSIS — F0781 Postconcussional syndrome: Secondary | ICD-10-CM

## 2018-09-22 MED ORDER — KETOROLAC TROMETHAMINE 30 MG/ML IJ SOLN
30.0000 mg | Freq: Once | INTRAMUSCULAR | Status: AC
  Administered 2018-09-22: 30 mg via INTRAVENOUS
  Filled 2018-09-22: qty 1

## 2018-09-22 MED ORDER — DEXTROSE 5 % IV SOLN
20.0000 mg | Freq: Once | INTRAVENOUS | Status: AC
  Administered 2018-09-22: 20 mg via INTRAVENOUS
  Filled 2018-09-22: qty 4

## 2018-09-22 MED ORDER — SODIUM CHLORIDE 0.9 % IV SOLN
1000.0000 mL | Freq: Once | INTRAVENOUS | Status: AC
  Administered 2018-09-22: 1000 mL via INTRAVENOUS

## 2018-09-22 MED ORDER — AMITRIPTYLINE HCL 10 MG PO TABS: 10.0000 mg | ORAL_TABLET | Freq: Every day | ORAL | 0 refills | Status: AC

## 2018-09-22 MED ORDER — DIPHENHYDRAMINE HCL 50 MG/ML IJ SOLN
25.0000 mg | Freq: Once | INTRAMUSCULAR | Status: AC
  Administered 2018-09-22: 25 mg via INTRAVENOUS
  Filled 2018-09-22: qty 1
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
## 2018-09-22 NOTE — ED Notes (Signed)
NAD noted at time of D/C. Pt denies questions or concerns. Pt ambulatory to the lobby at this time. Unable to obtain E-sig due to pad not working, verbal consent for D/C obtained.

## 2019-11-28 ENCOUNTER — Encounter: Payer: Self-pay | Admitting: *Deleted

## 2019-11-28 ENCOUNTER — Emergency Department: Payer: BC Managed Care – PPO

## 2019-11-28 ENCOUNTER — Emergency Department
Admission: EM | Admit: 2019-11-28 | Discharge: 2019-11-28 | Disposition: A | Discharge status: Home / Self Care | Payer: BC Managed Care – PPO | Attending: Emergency Medicine | Admitting: Emergency Medicine

## 2019-11-28 ENCOUNTER — Other Ambulatory Visit: Payer: Self-pay

## 2019-11-28 DIAGNOSIS — I10 Essential (primary) hypertension: Secondary | ICD-10-CM | POA: Insufficient documentation

## 2019-11-28 DIAGNOSIS — Z5321 Procedure and treatment not carried out due to patient leaving prior to being seen by health care provider: Secondary | ICD-10-CM | POA: Insufficient documentation

## 2019-11-28 LAB — BASIC METABOLIC PANEL
Anion gap: 9 (ref 5–15)
BUN: 12 mg/dL (ref 6–20)
CO2: 28 mmol/L (ref 22–32)
Calcium: 9.4 mg/dL (ref 8.9–10.3)
Chloride: 102 mmol/L (ref 98–111)
Creatinine, Ser: 0.79 mg/dL (ref 0.44–1.00)
GFR calc Af Amer: >60 mL/min (ref >60)
GFR calc non Af Amer: >60 mL/min (ref >60)
Glucose, Bld: 124 mg/dL — ABNORMAL HIGH (ref 70–99)
Potassium: 3.3 mmol/L — ABNORMAL LOW (ref 3.5–5.1)
Sodium: 139 mmol/L (ref 135–145)

## 2019-11-28 LAB — CBC
HCT: 35 % — ABNORMAL LOW (ref 36.0–46.0)
Hemoglobin: 11.9 g/dL — ABNORMAL LOW (ref 12.0–15.0)
MCH: 29.8 pg (ref 26.0–34.0)
MCHC: 34 g/dL (ref 30.0–36.0)
MCV: 87.5 fL (ref 80.0–100.0)
Platelets: 257 10*3/uL (ref 150–400)
RBC: 4 MIL/uL (ref 3.87–5.11)
RDW: 13.2 % (ref 11.5–15.5)
WBC: 8 10*3/uL (ref 4.0–10.5)
nRBC: 0 % (ref 0.0–0.2)

## 2019-11-28 LAB — TROPONIN I (HIGH SENSITIVITY): Troponin I (High Sensitivity): 3 ng/L (ref <18)

## 2019-11-28 MED ORDER — SODIUM CHLORIDE 0.9% FLUSH: 3.0000 mL | Freq: Once | INTRAVENOUS | Status: DC

## 2019-11-28 NOTE — ED Triage Notes (Signed)
Pt reports hypertension and tingling in left arm.  No headache.  Pt took a tylenol with relief.  Pt was at work when sx started.   No chest pain or sob.  Pt alert  Speech clear.

## 2019-11-29 ENCOUNTER — Telehealth: Payer: Self-pay | Admitting: Emergency Medicine
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
## 2019-12-26 ENCOUNTER — Other Ambulatory Visit: Payer: Self-pay | Admitting: Nurse Practitioner

## 2019-12-26 DIAGNOSIS — M542 Cervicalgia: Secondary | ICD-10-CM

## 2019-12-27 ENCOUNTER — Other Ambulatory Visit: Payer: Self-pay

## 2019-12-27 ENCOUNTER — Ambulatory Visit: Payer: BC Managed Care – PPO

## 2019-12-27 ENCOUNTER — Ambulatory Visit
Admission: RE | Admit: 2019-12-27 | Discharge: 2019-12-27 | Disposition: A | Discharge status: Home / Self Care | Payer: BC Managed Care – PPO | Source: Ambulatory Visit | Attending: Nurse Practitioner | Admitting: Nurse Practitioner

## 2019-12-27 ENCOUNTER — Other Ambulatory Visit: Payer: Self-pay | Admitting: Internal Medicine

## 2019-12-27 DIAGNOSIS — M542 Cervicalgia: Secondary | ICD-10-CM | POA: Diagnosis present

## 2020-01-10 ENCOUNTER — Ambulatory Visit: Payer: BC Managed Care – PPO

## 2020-05-27 ENCOUNTER — Other Ambulatory Visit: Payer: Self-pay | Admitting: Neurosurgery

## 2020-06-09 ENCOUNTER — Ambulatory Visit: Admit: 2020-06-09 | Payer: BC Managed Care – PPO | Admitting: Neurosurgery

## 2020-06-09 SURGERY — ANTERIOR CERVICAL DECOMPRESSION/DISCECTOMY FUSION 1 LEVEL
Anesthesia: General | Laterality: Left

## 2021-12-12 IMAGING — CR DG CHEST 2V
2 series · 2 of 2 positions shown · non-contrast
Comparison: None.

CLINICAL DATA: Tingling in the left arm

EXAM:
CHEST - 2 VIEW

[chest pa]
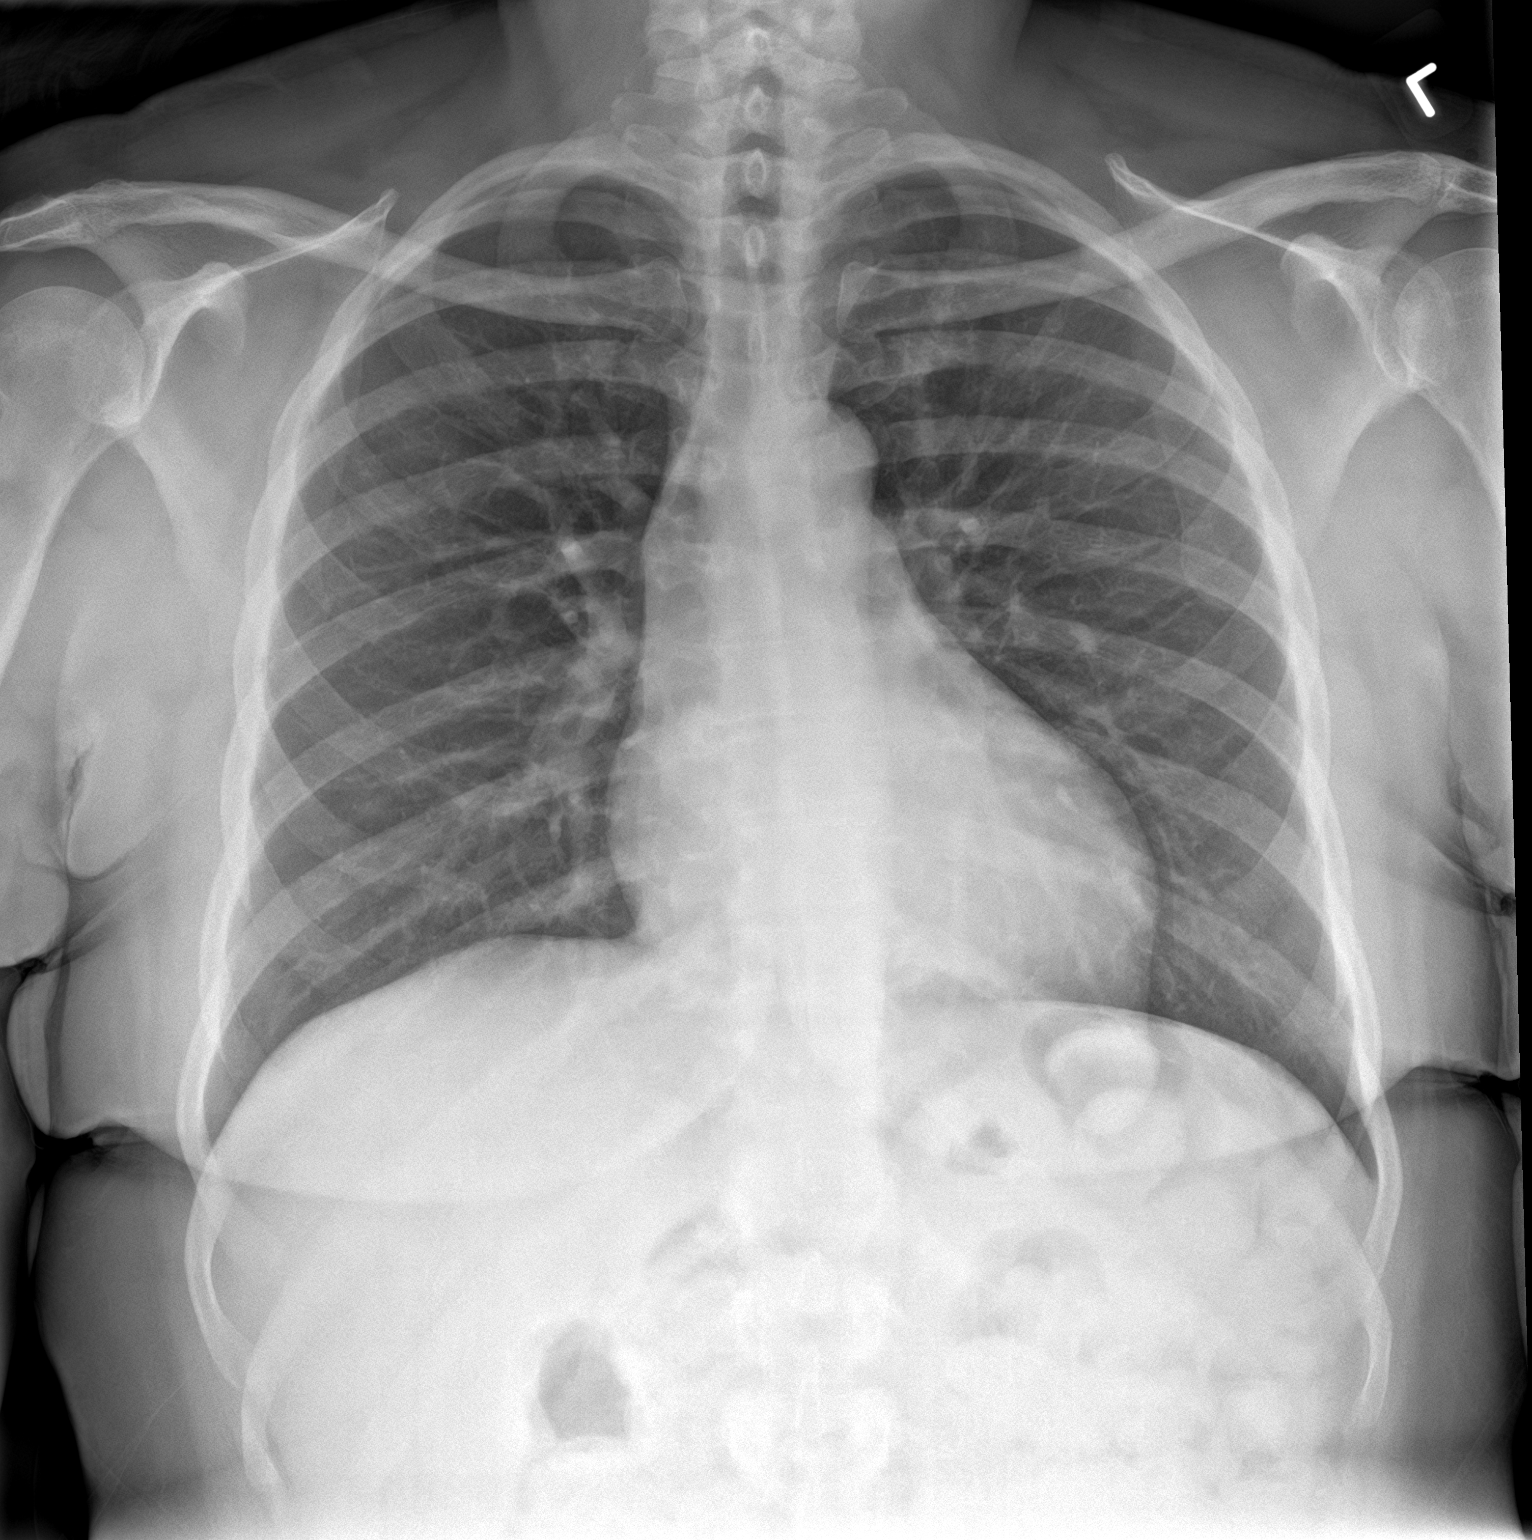

[chest lat]
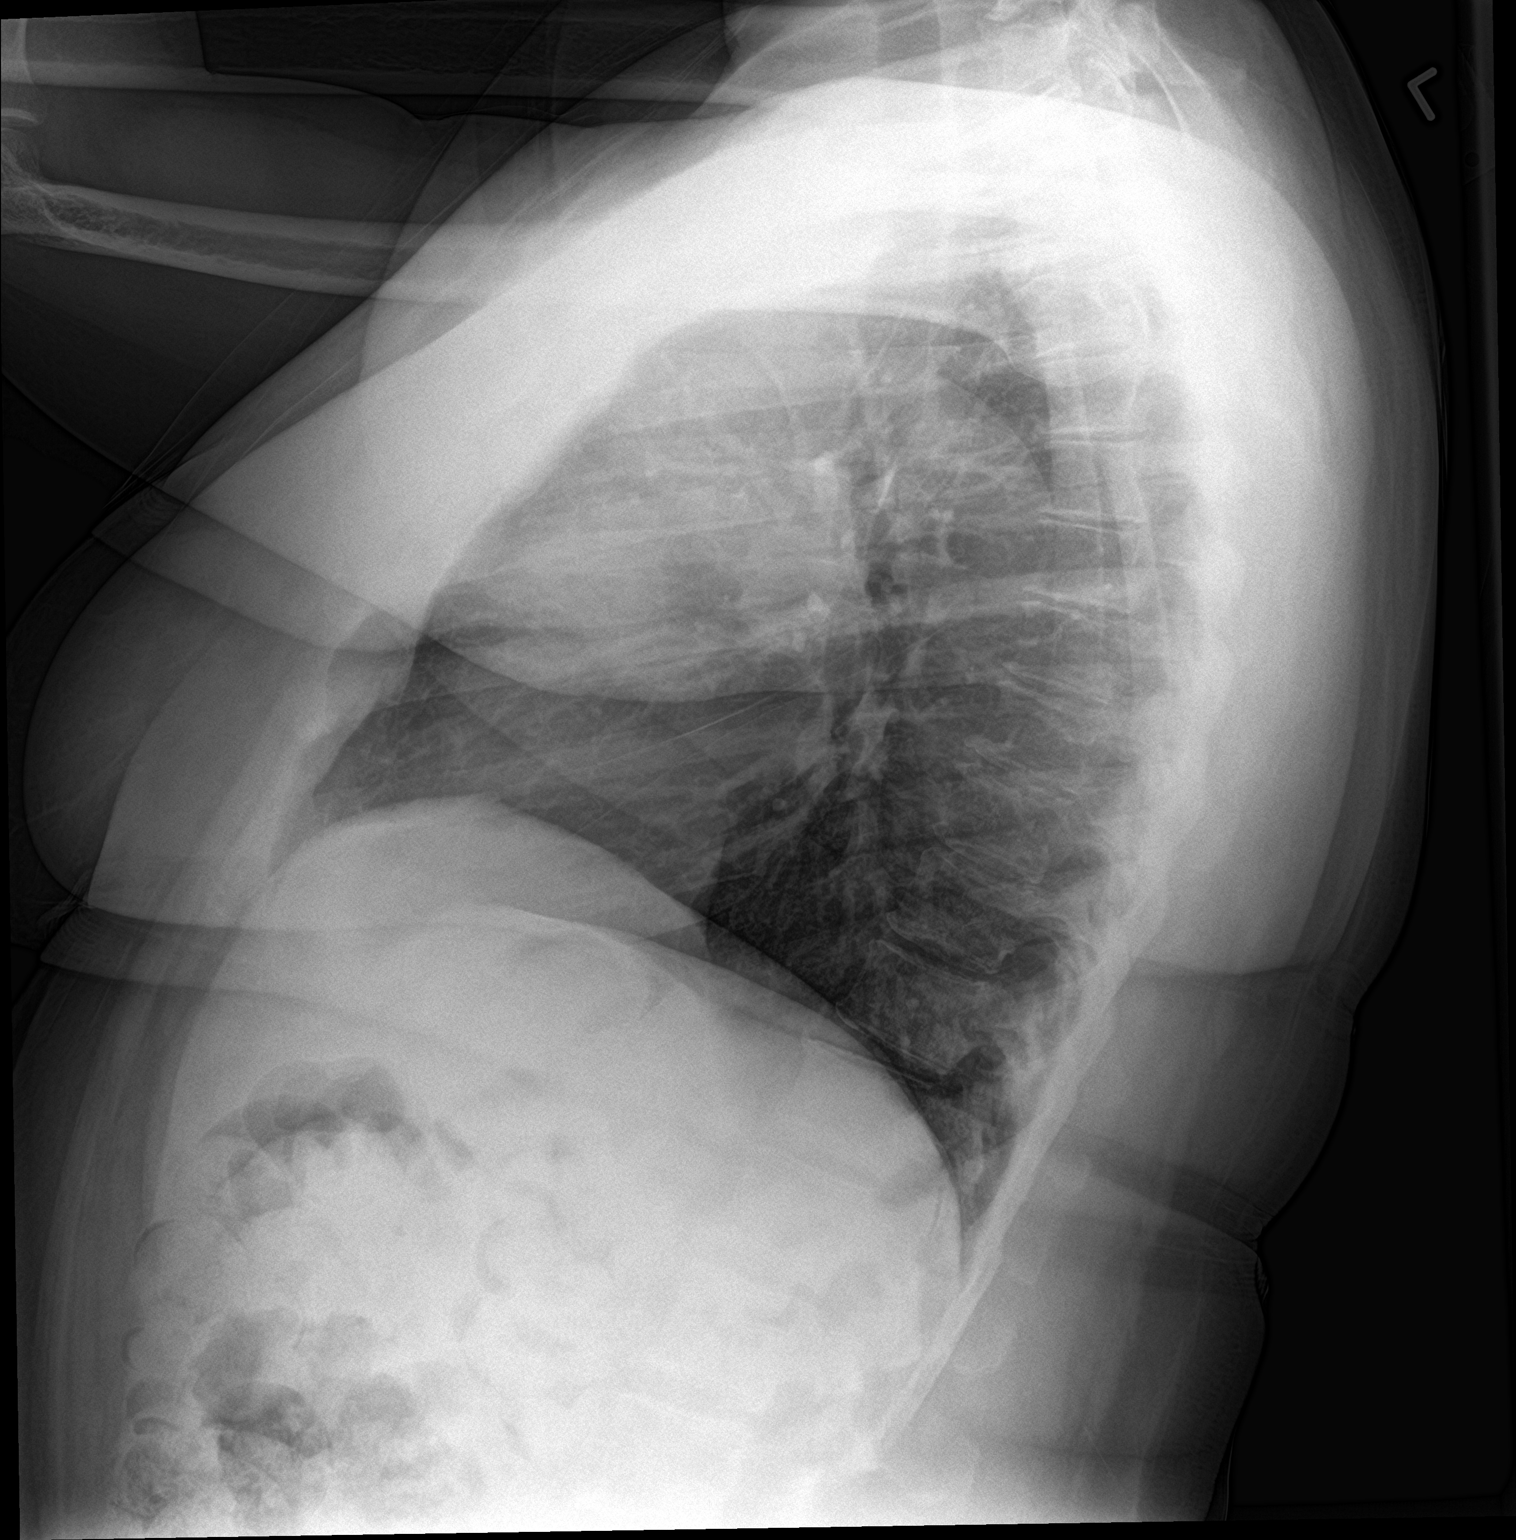

[2 of 2 positions shown; findings below may reference images not displayed]

FINDINGS: The heart size and mediastinal contours are within normal limits.
Both lungs are clear. The visualized skeletal structures are
unremarkable.
IMPRESSION: No active cardiopulmonary disease.
